# Patient Record
Sex: Male | Born: 1995 | Race: White | Hispanic: No | Marital: Single | Smoking: Former smoker
Health system: Southern US, Community
[De-identification: ages and names within clinical notes are randomized; demographics above are authoritative.]

## PROBLEM LIST (undated history)

## (undated) DIAGNOSIS — K649 Unspecified hemorrhoids: Secondary | ICD-10-CM

## (undated) HISTORY — PX: TONSILLECTOMY: SUR1361

## (undated) HISTORY — PX: ADENOIDECTOMY: SUR15

---
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
## 2013-10-08 ENCOUNTER — Ambulatory Visit: Payer: Self-pay | Admitting: Internal Medicine

## 2013-10-08 ENCOUNTER — Telehealth: Payer: Self-pay | Admitting: Internal Medicine

## 2013-10-08 NOTE — Telephone Encounter (Signed)
Patient can't come in today.  He is rescheduled from Dr. Juanda ChanceBrodie to Mike GipAmy Esterwood PA on 10/12/13

## 2013-10-12 ENCOUNTER — Other Ambulatory Visit (INDEPENDENT_AMBULATORY_CARE_PROVIDER_SITE_OTHER): Payer: Commercial Managed Care - PPO

## 2013-10-12 ENCOUNTER — Encounter: Payer: Self-pay | Admitting: Physician Assistant

## 2013-10-12 ENCOUNTER — Ambulatory Visit (INDEPENDENT_AMBULATORY_CARE_PROVIDER_SITE_OTHER): Payer: Commercial Managed Care - PPO | Admitting: Physician Assistant

## 2013-10-12 VITALS — BP 100/60 | HR 80 | Ht 70.0 in | Wt 213.6 lb

## 2013-10-12 DIAGNOSIS — K625 Hemorrhage of anus and rectum: Secondary | ICD-10-CM

## 2013-10-12 DIAGNOSIS — R197 Diarrhea, unspecified: Secondary | ICD-10-CM

## 2013-10-12 LAB — CBC WITH DIFFERENTIAL/PLATELET
BASOS ABS: 0 10*3/uL (ref 0.0–0.1)
Basophils Relative: 0.4 % (ref 0.0–3.0)
EOS ABS: 0.2 10*3/uL (ref 0.0–0.7)
Eosinophils Relative: 3.1 % (ref 0.0–5.0)
HEMATOCRIT: 46.4 % (ref 39.0–52.0)
Hemoglobin: 15.6 g/dL (ref 13.0–17.0)
Lymphocytes Relative: 34.3 % (ref 12.0–46.0)
Lymphs Abs: 2.4 10*3/uL (ref 0.7–4.0)
MCHC: 33.6 g/dL (ref 30.0–36.0)
MCV: 87.5 fl (ref 78.0–100.0)
Monocytes Absolute: 0.6 10*3/uL (ref 0.1–1.0)
Monocytes Relative: 8.3 % (ref 3.0–12.0)
NEUTROS ABS: 3.8 10*3/uL (ref 1.4–7.7)
Neutrophils Relative %: 53.9 % (ref 43.0–77.0)
PLATELETS: 183 10*3/uL (ref 150.0–400.0)
RBC: 5.3 Mil/uL (ref 4.22–5.81)
RDW: 13.6 % (ref 11.5–14.6)
WBC: 7 10*3/uL (ref 4.5–10.5)

## 2013-10-12 LAB — HIGH SENSITIVITY CRP: CRP HIGH SENSITIVITY: 0.9 mg/L (ref 0.000–5.000)

## 2013-10-12 NOTE — Patient Instructions (Addendum)
Please go to the basement level to have your labs drawn and stool test.  We will call you with the results. Continue the steroid rectal cream as needed.

## 2013-10-12 NOTE — Progress Notes (Signed)
Subjective:    Patient ID: Steven George, male    DOB: 08/27/1995, 18 y.o.   MRN: [id number]  HPI  Steven George is a generally healthy 18 -year-old male who is new to GI today , self-referred for evaluation of rectal bleeding and diarrhea. Patient said states that he never had any GI issues until after he return from a trip to [place] in December area he says he began having some problems with stomach ache while he was in [place] and then after he returned he developed diarrhea and had 3-4 liquid bowel movements per day over the next couple of months. He says the diarrhea seemed to subside for a few weeks and then recurred again in mid March and has been constant since. His appetite has been good his weight has been stable he denies any nausea or vomiting area no fever or chills. He says his stools remain loose to liquid he and nonbloody. He continues with intermittent "stomachaches". He says occasionally after a meal he will have urgency and very profuse diarrhea, other times he says he has a lot of straining in order to produce a bowel movement but still has loose stool. About 3 days ago he had some oozing of blood from his rectum which had occurred separate from a bowel movement. He says this just lasted for one day intermittently and then stopped and has not recurred. [facility] and was given a hemorrhoid cream to use. Says at that time he was able to feel a hemorrhoid externally and has resolved since His mother was concerned that he had some other issue because of the ongoing problems with diarrhea and suggested he come in for GI evaluation. Has not had any recent antibiotics, family history is negative for GI diseases far as he is aware.    Review of Systems  Constitutional: Negative.   HENT: Negative.   Eyes: Negative.   Respiratory: Negative.   Cardiovascular: Negative.   Gastrointestinal: Positive for abdominal pain, diarrhea and anal bleeding.  Endocrine: Negative.     Genitourinary: Negative.   Musculoskeletal: Negative.   Skin: Negative.   Allergic/Immunologic: Negative.   Neurological: Negative.   Hematological: Negative.   Psychiatric/Behavioral: Negative.    No outpatient prescriptions prior to visit.   No facility-administered medications prior to visit.      No Known Allergies There are no active problems to display for this patient.  History  Substance Use Topics  . Smoking status: Current Every Day Smoker  . Smokeless tobacco: Never Used  . Alcohol Use: Yes   History reviewed. No pertinent family history.No family hx of colon cancer/IBD   Objective:   Physical Exam well-developed white male in no acute distress blood pressure 100/60 pulse 80 height 5 foot 10 weight 213. HEENT; nontraumatic normocephalic EOMI PERRLA sclera anicteric, Supple; no JVD, Cardiovascular; regular rate and rhythm with S1-S2 no murmur or gallop, Pulm; clear bilaterally, Abdomen; soft he is tender in the left mid quadrant left lower quadrant there is no guarding or rebound no palpable mass or hepatosplenomegaly bowel sounds are present, Rectal; exam no external lesion noted , stool is currently Hemoccult negative.  Extremities ;no clubbing cyanosis or edema skin warm dry, Psych; mood and affect appropriate        Assessment & Plan:  #751  18 year old white male with 4 month history of diarrhea and abdominal discomfort onset after a trip to [place] in December 2014 Rule out new onset IBD, rule out infectious  etiology i.e. Parasitic/Giardia, rule out IBS #2 transient rectal bleeding likely secondary to hemorrhoid-rectal exam negative and stool currently Hemoccult negative  Plan CBC with differential and CRP Stool for O&P Will consider an empiric course of metronidazole if O&P is negative- failing improvement with metronidazole then would need colonoscopy,

## 2013-10-13 ENCOUNTER — Other Ambulatory Visit: Payer: Commercial Managed Care - PPO

## 2013-10-13 DIAGNOSIS — R197 Diarrhea, unspecified: Secondary | ICD-10-CM

## 2013-10-13 DIAGNOSIS — K625 Hemorrhage of anus and rectum: Secondary | ICD-10-CM

## 2013-10-13 NOTE — Progress Notes (Signed)
i agree with the plan above 

## 2013-10-14 ENCOUNTER — Other Ambulatory Visit: Payer: Self-pay | Admitting: *Deleted

## 2013-10-14 LAB — OVA AND PARASITE EXAMINATION: OP: NONE SEEN

## 2013-10-14 MED ORDER — METRONIDAZOLE 250 MG PO TABS: ORAL_TABLET | ORAL | Status: DC

## 2014-10-13 ENCOUNTER — Telehealth: Payer: Self-pay | Admitting: Physician Assistant

## 2014-10-13 NOTE — Telephone Encounter (Signed)
Spoke with the patient. He was seen last year for a similar complaint. He complains of a discomfort at the rectum with a bowel movement and a sensation of something "in the way". States he can feel something there all the time. It is "freaking" him out and he states he is "really scared". He says feels he never got better and in fact may be worse. Should he come in for an OV here?

## 2014-10-14 NOTE — Telephone Encounter (Signed)
Per office note here a year ago, it looks like the plan was for colonoscopy if he did not improve.  Please offer him that.  thanks

## 2014-10-14 NOTE — Telephone Encounter (Signed)
Spoke with the patient and he agrees with this plan of care. He will come for his pre-visit on 10/17/14 at 9:00am and his colonoscopy will be 10/25/14 in the [facility].

## 2014-10-17 ENCOUNTER — Ambulatory Visit (AMBULATORY_SURGERY_CENTER): Payer: Self-pay | Admitting: *Deleted

## 2014-10-17 VITALS — Ht 70.5 in | Wt 198.0 lb

## 2014-10-17 DIAGNOSIS — K625 Hemorrhage of anus and rectum: Secondary | ICD-10-CM

## 2014-10-17 MED ORDER — MOVIPREP 100 G PO SOLR: 1.0000 | Freq: Once | ORAL | Status: DC

## 2014-10-17 NOTE — Progress Notes (Signed)
No egg or soy allergy. No anesthesia problems.  No home O2.  No diet meds.  

## 2014-10-25 ENCOUNTER — Ambulatory Visit (AMBULATORY_SURGERY_CENTER): Payer: BLUE CROSS/BLUE SHIELD | Admitting: Gastroenterology

## 2014-10-25 ENCOUNTER — Encounter: Payer: Self-pay | Admitting: Gastroenterology

## 2014-10-25 VITALS — BP 113/45 | HR 70 | Temp 98.7°F | Resp 14 | Ht 70.0 in | Wt 198.0 lb

## 2014-10-25 DIAGNOSIS — K625 Hemorrhage of anus and rectum: Secondary | ICD-10-CM | POA: Diagnosis not present

## 2014-10-25 MED ORDER — SODIUM CHLORIDE 0.9 % IV SOLN: 500.0000 mL | INTRAVENOUS | Status: DC

## 2014-10-25 NOTE — Progress Notes (Signed)
Patient awakening,vss,report to rn 

## 2014-10-25 NOTE — Patient Instructions (Addendum)
YOU HAD AN ENDOSCOPIC PROCEDURE TODAY AT THE [facility]:   Refer to the procedure report that was given to you for any specific questions about what was found during the examination.  If the procedure report does not answer your questions, please call your gastroenterologist to clarify.  If you requested that your care partner not be given the details of your procedure findings, then the procedure report has been included in a sealed envelope for you to review at your convenience later.  YOU SHOULD EXPECT: Some feelings of bloating in the abdomen. Passage of more gas than usual.  Walking can help get rid of the air that was put into your GI tract during the procedure and reduce the bloating. If you had a lower endoscopy (such as a colonoscopy or flexible sigmoidoscopy) you may notice spotting of blood in your stool or on the toilet paper. If you underwent a bowel prep for your procedure, you may not have a normal bowel movement for a few days.  Please Note:  You might notice some irritation and congestion in your nose or some drainage.  This is from the oxygen used during your procedure.  There is no need for concern and it should clear up in a day or so.  SYMPTOMS TO REPORT IMMEDIATELY:   Following lower endoscopy (colonoscopy or flexible sigmoidoscopy):  Excessive amounts of blood in the stool  Significant tenderness or worsening of abdominal pains  Swelling of the abdomen that is new, acute  Fever of 100F or higher   For urgent or emergent issues, a gastroenterologist can be reached at any hour by calling [phone].   DIET: Your first meal following the procedure should be a small meal and then it is ok to progress to your normal diet. Heavy or fried foods are harder to digest and may make you feel nauseous or bloated.  Likewise, meals heavy in dairy and vegetables can increase bloating.  Drink plenty of fluids but you should avoid alcoholic beverages for 24  hours.  ACTIVITY:  You should plan to take it easy for the rest of today and you should NOT DRIVE or use heavy machinery until tomorrow (because of the sedation medicines used during the test).    FOLLOW UP: Our staff will call the number listed on your records the next business day following your procedure to check on you and address any questions or concerns that you may have regarding the information given to you following your procedure. If we do not reach you, we will leave a message.  However, if you are feeling well and you are not experiencing any problems, there is no need to return our call.  We will assume that you have returned to your regular daily activities without incident.  If any biopsies were taken you will be contacted by phone or by letter within the next 1-3 weeks.  Please call us at [phone] if you have not heard about the biopsies in 3 weeks.    SIGNATURES/CONFIDENTIALITY: You and/or your care partner have signed paperwork which will be entered into your electronic medical record.  These signatures attest to the fact that that the information above on your After Visit Summary has been reviewed and is understood.  Full responsibility of the confidentiality of this discharge information lies with you and/or your care-partner.  Take over the counter fiber supplement.

## 2014-10-25 NOTE — Op Note (Signed)
[facility] [address] [place], [id number]   COLONOSCOPY PROCEDURE REPORT  PATIENT: Steven George, Steven George  MR#: [id number] BIRTHDATE: 09/06/1995 , 19  yrs. old GENDER: male ENDOSCOPIST: Rachael Feeaniel P Andrew Blasius, MD PROCEDURE DATE:  10/25/2014 PROCEDURE:   Colonoscopy, diagnostic First Screening Colonoscopy - Avg.  risk and is 50 yrs.  old or older - No.  Prior Negative Screening - Now for repeat screening. N/A  History of Adenoma - Now for follow-up colonoscopy & has been > or = to 3 yrs.  N/A ASA CLASS:   Class I INDICATIONS:rectal discomfort. MEDICATIONS: Monitored anesthesia care and Propofol 500 mg IV  DESCRIPTION OF PROCEDURE:   After the risks benefits and alternatives of the procedure were thoroughly explained, informed consent was obtained.  The digital rectal exam revealed no abnormalities of the rectum.   The LB [id number]  endoscope was introduced through the anus and advanced to the cecum, which was identified by both the appendix and ileocecal valve. No adverse events experienced.   The quality of the prep was excellent.  The instrument was then slowly withdrawn as the colon was fully examined.   COLON FINDINGS: A normal appearing cecum, ileocecal valve, and appendiceal orifice were identified.  The ascending, transverse, descending, sigmoid colon, and rectum appeared unremarkable. Retroflexed views revealed no abnormalities. The time to cecum = 1.3 Withdrawal time = 5.2   The scope was withdrawn and the procedure completed. COMPLICATIONS: There were no immediate complications.  ENDOSCOPIC IMPRESSION: Normal colonoscopy  RECOMMENDATIONS: Please start once daily fiber supplement (orange flavored citrucel powder)  eSigned:  Rachael Feeaniel P Mckinley Olheiser, MD 10/25/2014 3:59 PM

## 2014-10-26 ENCOUNTER — Telehealth: Payer: Self-pay | Admitting: *Deleted

## 2014-10-26 NOTE — Telephone Encounter (Signed)
  Follow up Call-  Call back number 10/25/2014  Post procedure Call Back phone  # [phone]  Permission to leave phone message Yes     Patient questions:  Do you have a fever, pain , or abdominal swelling? No. Pain Score  0 *  Have you tolerated food without any problems? Yes.    Have you been able to return to your normal activities? Yes.    Do you have any questions about your discharge instructions: Diet   No. Medications  No. Follow up visit  No.  Do you have questions or concerns about your Care? No.  Actions: * If pain score is 4 or above: No action needed, pain <4.

## 2016-08-22 ENCOUNTER — Encounter (INDEPENDENT_AMBULATORY_CARE_PROVIDER_SITE_OTHER): Payer: Self-pay

## 2016-08-22 ENCOUNTER — Ambulatory Visit (INDEPENDENT_AMBULATORY_CARE_PROVIDER_SITE_OTHER): Payer: BLUE CROSS/BLUE SHIELD | Admitting: Nurse Practitioner

## 2016-08-22 ENCOUNTER — Encounter: Payer: Self-pay | Admitting: Nurse Practitioner

## 2016-08-22 VITALS — BP 130/82 | HR 68 | Ht 69.5 in | Wt 188.0 lb

## 2016-08-22 DIAGNOSIS — Z01818 Encounter for other preprocedural examination: Secondary | ICD-10-CM | POA: Diagnosis not present

## 2016-08-22 NOTE — Progress Notes (Signed)
Electrophysiology Consult Note Date: 08/22/2016  ID:  Steven George, DOB January 30, 1996, MRN [id number]  PCP: Pcp Not In System Referring MD: Warren Danes (orthodontic surgery)  CC: ?WPW  Steven George is a 21 y.o. 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  He was at the orthodontic surgeon's office earlier today pending wisdom teeth extraction.  Rhythm strip at that time was concerning for WPW and his surgery was postphoned until he could be evaluated by cardiology.  He has no significant past medical history. He has not had prior cardiac evaluation.  Rhythm strip from office earlier today is not available.    He denies chest pain, palpitations, dyspnea, PND, orthopnea, nausea, vomiting, dizziness, syncope, edema, weight gain, or early satiety.  He does have mouth pain from wisdom teeth. He is very active and played 3 hours of basketball yesterday without chest pain or shortness of breath.  He has had an intentional 30 pound weight loss in the last 6 months.   No past medical history on file. Past Surgical History:  Procedure Laterality Date  . ADENOIDECTOMY    . TONSILLECTOMY      No current outpatient prescriptions on file.   No current facility-administered medications for this visit.     Allergies:   Patient has no known allergies.   Social History: Social History   Social History  . Marital status: Single    Spouse name: N/A  . Number of children: N/A  . Years of education: N/A   Occupational History  . Not on file.   Social History Main Topics  . Smoking status: Former Smoker    Packs/day: 0.50    Types: Cigarettes  . Smokeless tobacco: Never Used  . Alcohol use 0.0 oz/week     Comment: occasionally  . Drug use: Yes    Frequency: 2.0 times per week     Comment: marijuana  . Sexual activity: Not on file   Other Topics Concern  . Not on file   Social History Narrative  . No narrative on file    Family History: Family History  Problem  Relation Age of Onset  . Colon cancer Maternal Grandmother 80    Review of Systems: All other systems reviewed and are otherwise negative except as noted above.   Physical Exam: VS:  BP 130/82   Pulse 68   Ht 5' 9.5" (1.765 m)   Wt 188 lb (85.3 kg)   BMI 27.36 kg/m  , BMI Body mass index is 27.36 kg/m. Wt Readings from Last 3 Encounters:  08/22/16 188 lb (85.3 kg)  10/25/14 198 lb (89.8 kg)  10/17/14 198 lb (89.8 kg)    GEN- The patient is well appearing, alert and oriented x 3 today.   HEENT: normocephalic, atraumatic; sclera clear, conjunctiva pink; hearing intact; oropharynx clear; neck supple Lungs- Clear to ausculation bilaterally, normal work of breathing.  No wheezes, rales, rhonchi Heart- Regular rate and rhythm, no murmurs, rubs or gallops, PMI not laterally displaced GI- soft, non-tender, non-distended, bowel sounds present Extremities- no clubbing, cyanosis, or edema MS- no significant deformity or atrophy Skin- warm and dry, no rash or lesion  Psych- euthymic mood, full affect Neuro- strength and sensation are intact   EKG:  EKG is ordered today. The ekg ordered today shows sinus rhythm, rate 68, normal intervals, no evidence of pre-excitation  Recent Labs: No results found for requested labs within last 8760 hours.   Assessment and Plan: 1.  ?WPW EKG  today demonstrates sinus rhythm with no pre-excitation He has no functional limitations with regard to exercise and is able to complete >5 Mets without chest pain or shortness of breath Ok from cardiac standpoint to proceed with wisdom teeth extraction Please call with questions.    Current medicines are reviewed at length with the patient today.   The patient does not have concerns regarding his medicines.  The following changes were made today:  none  Labs/ tests ordered today include:  No orders of the defined types were placed in this encounter.    Disposition:   Follow up with EP prn      Signed, Gypsy BalsamAmber Seiler, NP 08/22/2016 12:41 PM   [facility] [address] [place] [address] [phone] (office) [phone] (fax)

## 2016-08-22 NOTE — Patient Instructions (Addendum)
Pt seen today for concerns about possible WPW on EKG.  EKG reviewed and normal - no evidence of pre-excitation.  He is cleared from cardiac standpoint to proceed with oral surgery. Please call with questions.  Steven BalsamAmber Izreal Kock, NP   Medication Instructions:   Your physician recommends that you continue on your current medications as directed. Please refer to the Current Medication list given to you today.  If you need a refill on your cardiac medications before your next appointment, please call your pharmacy.  Labwork: NONE ORDERED  TODAY    Testing/Procedures: NONE ORDERED  TODAY    Follow-Up: CONTACT [facility] [phone] AS NEEDED FOR  ANY CARDIAC RELATED SYMPTOMS    Any Other Special Instructions Will Be Listed Below (If Applicable).

## 2016-12-15 ENCOUNTER — Encounter (HOSPITAL_COMMUNITY): Payer: Self-pay | Admitting: Emergency Medicine

## 2016-12-15 ENCOUNTER — Ambulatory Visit (HOSPITAL_COMMUNITY)
Admission: EM | Admit: 2016-12-15 | Discharge: 2016-12-15 | Disposition: A | Discharge status: Home / Self Care | Payer: BLUE CROSS/BLUE SHIELD | Attending: Internal Medicine | Admitting: Internal Medicine

## 2016-12-15 DIAGNOSIS — K0889 Other specified disorders of teeth and supporting structures: Secondary | ICD-10-CM | POA: Diagnosis not present

## 2016-12-15 DIAGNOSIS — K047 Periapical abscess without sinus: Secondary | ICD-10-CM

## 2016-12-15 MED ORDER — CLINDAMYCIN HCL 300 MG PO CAPS: 300.0000 mg | ORAL_CAPSULE | Freq: Three times a day (TID) | ORAL | 0 refills | Status: DC

## 2016-12-15 MED ORDER — HYDROCODONE-ACETAMINOPHEN 5-325 MG PO TABS: 1.0000 | ORAL_TABLET | ORAL | 0 refills | Status: DC | PRN

## 2016-12-15 NOTE — ED Triage Notes (Signed)
The patient presented to the [facility] with a complaint of dental pain that started last night. 

## 2016-12-17 NOTE — ED Provider Notes (Signed)
CSN: [id number]     Arrival date & time 12/15/16  1203 History   None    Chief Complaint  Patient presents with  . Dental Pain   (Consider location/radiation/quality/duration/timing/severity/associated sxs/prior Treatment) Patient c/o dental pain starting last night.   The history is provided by the patient.  Dental Pain  Location:  Upper Upper teeth location:  1/RU 3rd molar Quality:  Aching Severity:  Moderate Onset quality:  Sudden Duration:  2 days Timing:  Constant Progression:  Worsening Chronicity:  New   History reviewed. No pertinent past medical history. Past Surgical History:  Procedure Laterality Date  . ADENOIDECTOMY    . TONSILLECTOMY     Family History  Problem Relation Age of Onset  . Colon cancer Maternal Grandmother 7080   Social History  Substance Use Topics  . Smoking status: Former Smoker    Packs/day: 0.50    Types: Cigarettes  . Smokeless tobacco: Never Used  . Alcohol use 0.0 oz/week     Comment: occasionally    Review of Systems  Constitutional: Negative.   HENT: Positive for dental problem.   Eyes: Negative.   Respiratory: Negative.   Cardiovascular: Negative.   Gastrointestinal: Negative.   Endocrine: Negative.   Genitourinary: Negative.   Musculoskeletal: Negative.   Allergic/Immunologic: Negative.   Neurological: Negative.   Hematological: Negative.   Psychiatric/Behavioral: Negative.     Allergies  Patient has no known allergies.  Home Medications   Prior to Admission medications   Medication Sig Start Date End Date Taking? Authorizing Provider  acetaminophen (TYLENOL) 500 MG chewable tablet Chew 1,000 mg by mouth every 6 (six) hours as needed for pain.   Yes [provider]  clindamycin (CLEOCIN) 300 MG capsule Take 1 capsule (300 mg total) by mouth 3 (three) times daily. 12/15/16   Deatra Canterxford, Kinesha Auten J, FNP  HYDROcodone-acetaminophen (NORCO/VICODIN) 5-325 MG tablet Take 1 tablet by mouth every 4 (four) hours as  needed. 12/15/16   Deatra Canterxford, Tearra Ouk J, FNP   Meds Ordered and Administered this Visit  Medications - No data to display  BP (!) 110/58 (BP Location: Left Arm)   Pulse 62   Temp 98.6 F (37 C) (Oral)   Resp 16   SpO2 98%  No data found.   Physical Exam  Constitutional: He is oriented to person, place, and time. He appears well-developed and well-nourished.  HENT:  Head: Normocephalic and atraumatic.  Right upper molar tender.  Eyes: Conjunctivae and EOM are normal. Pupils are equal, round, and reactive to light.  Neck: Normal range of motion. Neck supple.  Cardiovascular: Normal rate, regular rhythm and normal heart sounds.   Pulmonary/Chest: Effort normal and breath sounds normal.  Neurological: He is alert and oriented to person, place, and time.  Nursing note and vitals reviewed.   Urgent Care Course     Procedures (including critical care time)  Labs Review Labs Reviewed - No data to display  Imaging Review No results found.   Visual Acuity Review  Right Eye Distance:   Left Eye Distance:   Bilateral Distance:    Right Eye Near:   Left Eye Near:    Bilateral Near:         MDM   1. Pain, dental   2. Dental abscess    Norco 5/325 one po q 6 hours prn #6 Clindamycin 150mg  one po tid 330      Deatra CanterOxford, Chisum Habenicht J, [place]FNP 12/17/16 1626

## 2017-02-08 ENCOUNTER — Encounter (HOSPITAL_COMMUNITY): Payer: Self-pay | Admitting: Family Medicine

## 2017-02-08 ENCOUNTER — Ambulatory Visit (HOSPITAL_COMMUNITY)
Admission: EM | Admit: 2017-02-08 | Discharge: 2017-02-08 | Disposition: A | Discharge status: Home / Self Care | Payer: BLUE CROSS/BLUE SHIELD | Attending: Family Medicine | Admitting: Family Medicine

## 2017-02-08 ENCOUNTER — Ambulatory Visit (INDEPENDENT_AMBULATORY_CARE_PROVIDER_SITE_OTHER): Payer: BLUE CROSS/BLUE SHIELD

## 2017-02-08 DIAGNOSIS — S9032XA Contusion of left foot, initial encounter: Secondary | ICD-10-CM

## 2017-02-08 MED ORDER — MELOXICAM 15 MG PO TABS: 15.0000 mg | ORAL_TABLET | Freq: Every day | ORAL | 0 refills | Status: DC

## 2017-02-08 NOTE — ED Provider Notes (Signed)
  [facility]   [id number] 02/08/17 Arrival Time: 1323  ASSESSMENT & PLAN:  1. Contusion of left foot, initial encounter     Meds ordered this encounter  Medications  . meloxicam (MOBIC) 15 MG tablet    Sig: Take 1 tablet (15 mg total) by mouth daily.    Dispense:  30 tablet    Refill:  0    Order Specific Question:   Supervising Provider    Answer:   Mardella LaymanHAGLER, BRIAN [[id number]]    X-rays negative, recommend Rice, meloxicam for pain and inflammation. Follow up with orthopedist needed Reviewed expectations re: course of current medical issues. Questions answered. Outlined signs and symptoms indicating need for more acute intervention. Patient verbalized understanding. After Visit Summary given.   SUBJECTIVE:  Steven George is a 21 y.o. male who presents with complaint of Left foot pain. He states he is on vacation last week, was playing basketball, landed on his foot wrong way. He has pain with walking, and with movement of the foot. Denies any other complaints  ROS: As per HPI, remainder of ROS negative.   OBJECTIVE:  Vitals:   02/08/17 1345  BP: 121/79  Pulse: 65  Resp: 18  Temp: 98.4 F (36.9 C)  SpO2: 100%     General appearance: alert; no distress HEENT: normocephalic; atraumatic; conjunctivae normal;  Extremities: no cyanosis or edema; symmetrical with no gross deformities, There is no tenderness over the medial malleolus, lateral malleolus, base of the first toe, navicular, calcaneus, however there is tenderness over the fifth metatarsal Skin: warm and dry Neurologic: Grossly normal Psychological:  alert and cooperative; normal mood and affect  Procedures:    Labs Reviewed - No data to display  Dg Foot Complete Left  Result Date: 02/08/2017 CLINICAL DATA:  21 year old male with history of injury to the left foot while planning basketball 4 days ago. Continued pain along the lateral side of the foot. EXAM: LEFT FOOT - COMPLETE 3+ VIEW  COMPARISON:  No priors. FINDINGS: There is no evidence of fracture or dislocation. There is no evidence of arthropathy or other focal bone abnormality. Soft tissues are unremarkable. IMPRESSION: Negative. Electronically Signed   By: Trudie Reedaniel  Entrikin M.D.   On: 02/08/2017 14:22    No Known Allergies  PMHx, SurgHx, SocialHx, Medications, and Allergies were reviewed in the Visit Navigator and updated as appropriate.       Dorena BodoKennard, Jevan Gaunt, NP 02/08/17 1447

## 2017-02-08 NOTE — Discharge Instructions (Signed)
No fracture dislocation were seen on your x-ray. Most likely contusion secondary to your injury. I recommend rest, ice, compression of your foot to the wearing of an Ace bandage, and elevation when possible, for pain and inflammation of starting on meloxicam, one tablet daily. If pain persists past one to 2 weeks, follow up with an orthopedist

## 2017-02-08 NOTE — ED Triage Notes (Signed)
Pt here for left foot pain since Tuesday. sts that he was playing basketball and landed on it wrong.

## 2017-05-24 ENCOUNTER — Ambulatory Visit (HOSPITAL_COMMUNITY)
Admission: EM | Admit: 2017-05-24 | Discharge: 2017-05-24 | Disposition: A | Discharge status: Home / Self Care | Payer: BLUE CROSS/BLUE SHIELD

## 2017-05-24 ENCOUNTER — Encounter (HOSPITAL_COMMUNITY): Payer: Self-pay | Admitting: *Deleted

## 2017-05-24 HISTORY — DX: Unspecified hemorrhoids: K64.9

## 2017-05-24 NOTE — ED Notes (Signed)
Called twice in the lobby, no answer

## 2017-05-24 NOTE — ED Triage Notes (Signed)
Started with hemorrhoid flare-up yesterday; + bleeding.  Has been using Preparation H with little relief.

## 2017-07-02 ENCOUNTER — Other Ambulatory Visit: Payer: Self-pay

## 2017-07-02 ENCOUNTER — Ambulatory Visit (HOSPITAL_COMMUNITY)
Admission: EM | Admit: 2017-07-02 | Discharge: 2017-07-02 | Disposition: A | Discharge status: Home / Self Care | Payer: BLUE CROSS/BLUE SHIELD | Attending: Family Medicine | Admitting: Family Medicine

## 2017-07-02 ENCOUNTER — Encounter (HOSPITAL_COMMUNITY): Payer: Self-pay | Admitting: Emergency Medicine

## 2017-07-02 DIAGNOSIS — K644 Residual hemorrhoidal skin tags: Secondary | ICD-10-CM

## 2017-07-02 MED ORDER — HYDROCORTISONE 2.5 % RE CREA: TOPICAL_CREAM | RECTAL | 0 refills | Status: AC

## 2017-07-02 NOTE — ED Provider Notes (Signed)
[facility]    CSN: [id number] Arrival date & time: 07/02/17  1349     History   Chief Complaint Chief Complaint  Patient presents with  . Hemorrhoids    HPI Steven George is a 22 y.o. male presenting with concern of hemorrhoids. States he has had rectal pain with sitting and defecation. Began a few weeks ago, has been using preporation H, symptoms improved but then woke up this morning with increased pain. States originally had blood with wiping in the beginning but his has resolved. 2 bowel movements a day, does not have to strain, consistency between loose and hard. Drinks a lot of water, no recent illness with coughing. Plays basketball but does not lift weights.  HPI  Past Medical History:  Diagnosis Date  . Hemorrhoids     There are no active problems to display for this patient.   Past Surgical History:  Procedure Laterality Date  . ADENOIDECTOMY    . TONSILLECTOMY         Home Medications    Prior to Admission medications   Medication Sig Start Date End Date Taking? Authorizing Provider  hydrocortisone (ANUSOL-HC) 2.5 % rectal cream Apply rectally 2 times daily 07/02/17   Wieters, Junius Creamer, PA-C    Family History Family History  Problem Relation Age of Onset  . Colon cancer Maternal Grandmother 56    Social History Social History   Tobacco Use  . Smoking status: Former Smoker    Packs/day: 0.50    Types: Cigarettes  . Smokeless tobacco: Never Used  Substance Use Topics  . Alcohol use: Yes    Alcohol/week: 0.0 oz    Comment: occasionally  . Drug use: No    Comment: marijuana     Allergies   Patient has no known allergies.   Review of Systems Review of Systems  Constitutional: Negative for fever.  Respiratory: Negative for cough.   Gastrointestinal: Positive for anal bleeding and rectal pain. Negative for abdominal pain, blood in stool, diarrhea, nausea and vomiting.  Skin: Negative for rash.  Neurological: Negative for  headaches.     Physical Exam Triage Vital Signs ED Triage Vitals  Enc Vitals Group     BP 07/02/17 1444 116/70     Pulse Rate 07/02/17 1444 74     Resp --      Temp 07/02/17 1444 98.3 F (36.8 C)     Temp Source 07/02/17 1444 Oral     SpO2 07/02/17 1444 98 %     Weight --      Height --      Head Circumference --      Peak Flow --      Pain Score 07/02/17 1443 6     Pain Loc --      Pain Edu? --      Excl. in GC? --    No data found.  Updated Vital Signs BP 116/70 (BP Location: Left Arm)   Pulse 74   Temp 98.3 F (36.8 C) (Oral)   SpO2 98%   Visual Acuity Right Eye Distance:   Left Eye Distance:   Bilateral Distance:    Right Eye Near:   Left Eye Near:    Bilateral Near:     Physical Exam  Constitutional: He appears well-developed and well-nourished.  HENT:  Head: Normocephalic and atraumatic.  Eyes: Conjunctivae are normal.  Neck: Neck supple.  Cardiovascular: Normal rate and regular rhythm.  No murmur heard. Pulmonary/Chest: Effort normal and  breath sounds normal. No respiratory distress.  Abdominal: Soft. There is no tenderness.  Genitourinary:  Genitourinary Comments: External anus with small 1 cm hemorrhoid mostly still pink, one small dot of purple; internal exam without hemorrhoids.  Musculoskeletal: He exhibits no edema.  Neurological: He is alert.  Skin: Skin is warm and dry.  Psychiatric: He has a normal mood and affect.  Nursing note and vitals reviewed.    UC Treatments / Results  Labs (all labs ordered are listed, but only abnormal results are displayed) Labs Reviewed - No data to display  EKG  EKG Interpretation None       Radiology No results found.  Procedures Procedures (including critical care time)  Medications Ordered in UC Medications - No data to display   Initial Impression / Assessment and Plan / UC Course  I have reviewed the triage vital signs and the nursing notes.  Pertinent labs & imaging results that  were available during my care of the patient were reviewed by me and considered in my medical decision making (see chart for details).     Patient with small non-thrombosed hemorrhoid. Advised anusol cream twice daily. Unclear cause of hemorrhoids does not appear to be constipated or have any increased straining. Discussed strict return precautions. Patient verbalized understanding and is agreeable with plan.   Final Clinical Impressions(s) / UC Diagnoses   Final diagnoses:  External hemorrhoid    ED Discharge Orders        Ordered    hydrocortisone (ANUSOL-HC) 2.5 % rectal cream     07/02/17 1525       Controlled Substance Prescriptions [place] Controlled Substance Registry consulted? Not Applicable   Lew DawesWieters, Hallie C, [place]PA-C 07/02/17 2252

## 2017-07-02 NOTE — ED Triage Notes (Signed)
Pt has been suffering from hemorrhoids for over one month.  Pt was using OTC medication with some relief, but they flared up again in the last few days.

## 2017-07-02 NOTE — Discharge Instructions (Signed)
Please use anusol cream twice daily, apply thin amount.  Please return if not resolving in 1 week.

## 2018-07-27 IMAGING — DX DG FOOT COMPLETE 3+V*L*
3 series · 3 of 3 positions shown · non-contrast
Comparison: No priors.

CLINICAL DATA: 21-year-old male with history of injury to the left
foot while planning basketball 4 days ago. Continued pain along the
lateral side of the foot.

EXAM:
LEFT FOOT - COMPLETE 3+ VIEW

[foot ap]
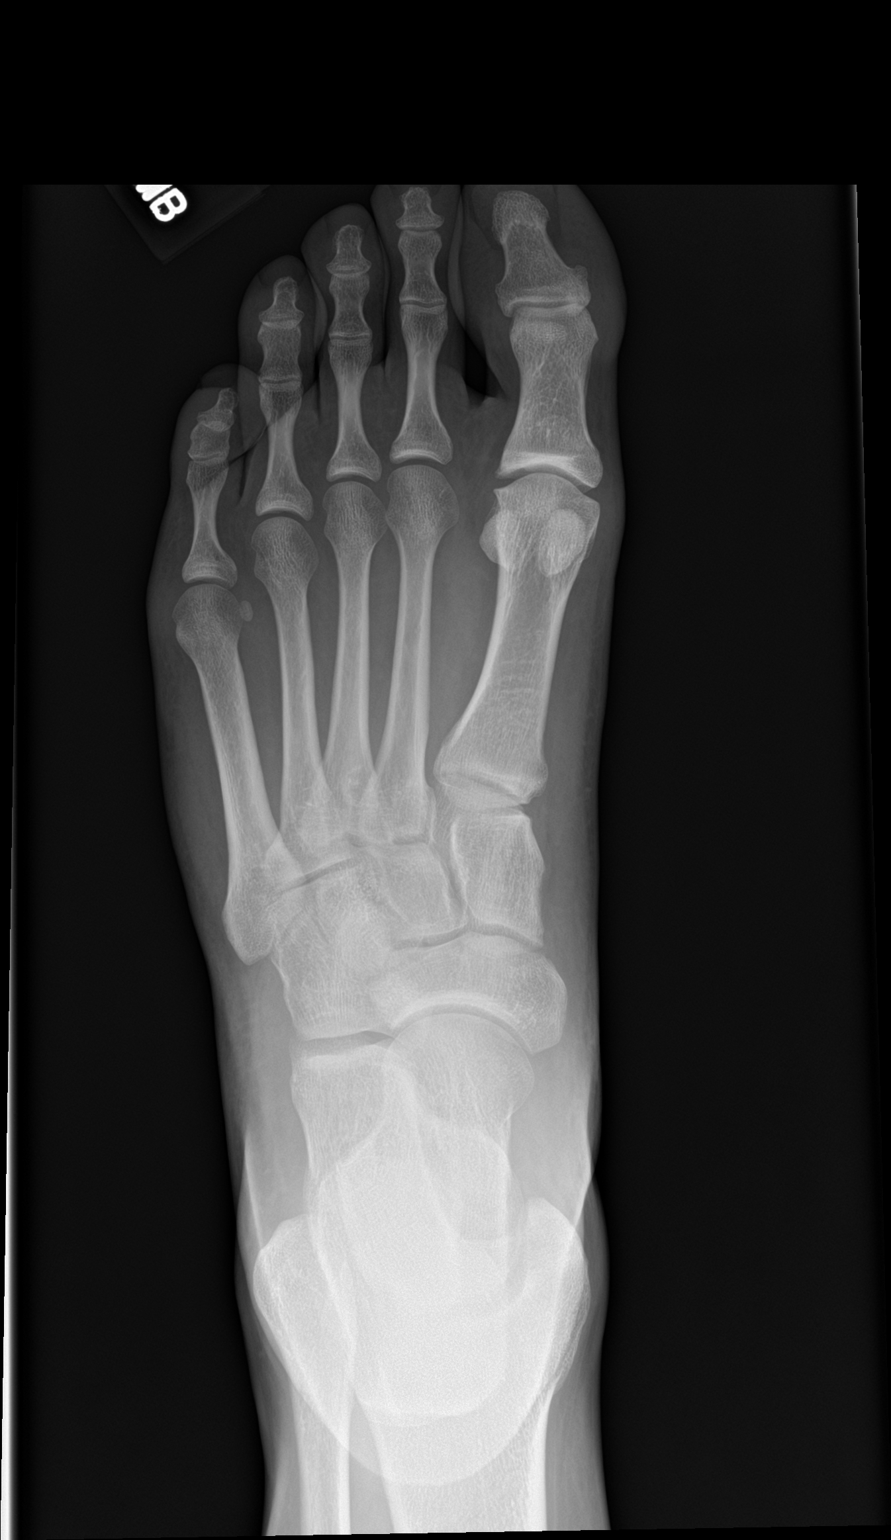

[foot obl]
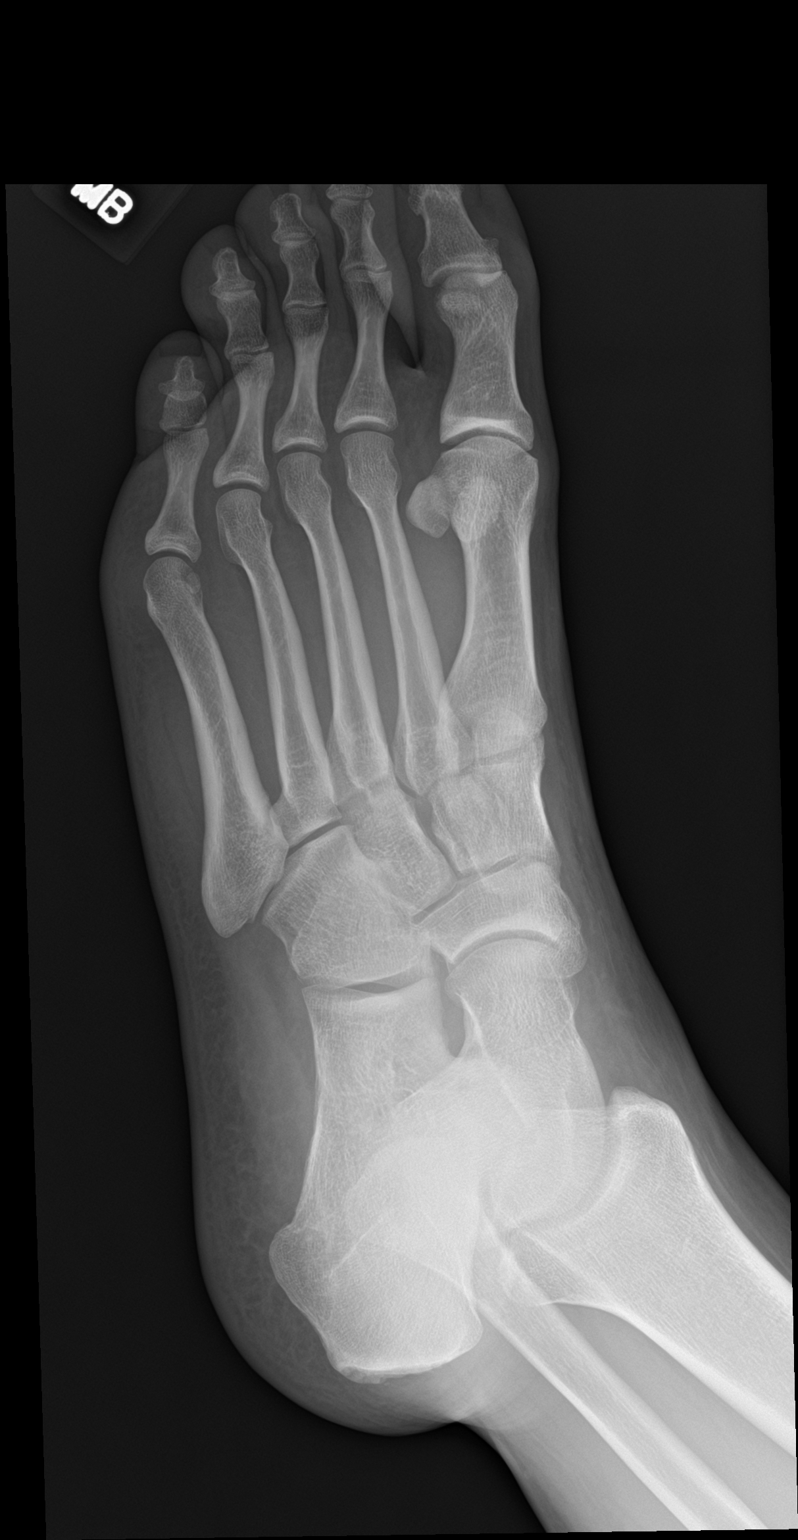

[foot lat]
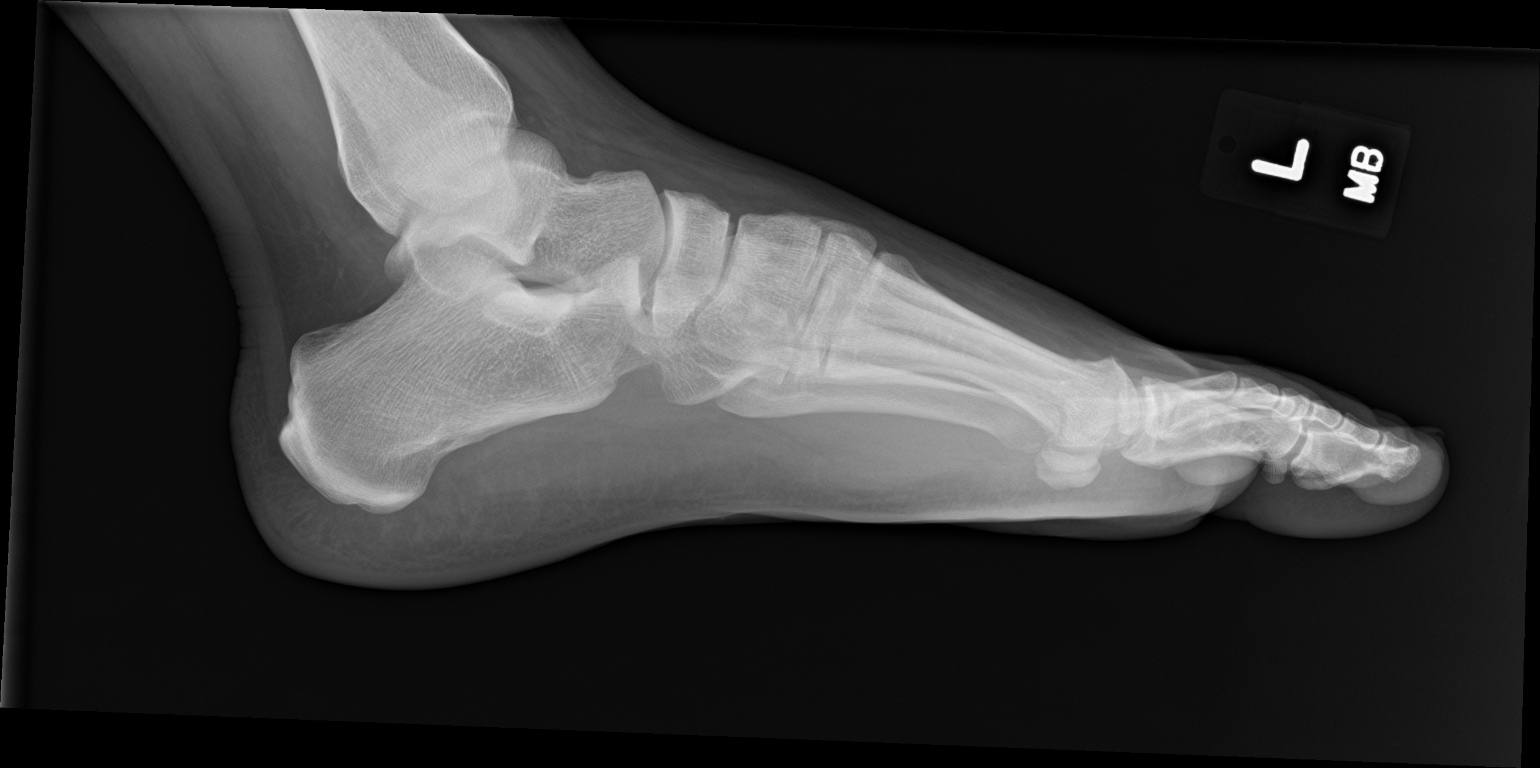

[3 of 3 positions shown; findings below may reference images not displayed]

FINDINGS: There is no evidence of fracture or dislocation. There is no
evidence of arthropathy or other focal bone abnormality. Soft
tissues are unremarkable.
IMPRESSION: Negative.
